# Patient Record
Sex: Female | Born: 1955 | Race: Black or African American | Hispanic: No | Marital: Married | Smoking: Never smoker
Health system: Southern US, Community
[De-identification: ages and names within clinical notes are randomized; demographics above are authoritative.]

## PROBLEM LIST (undated history)

## (undated) ENCOUNTER — Emergency Department (HOSPITAL_BASED_OUTPATIENT_CLINIC_OR_DEPARTMENT_OTHER): Discharge status: Absent without leave | Payer: MEDICARE

## (undated) DIAGNOSIS — E039 Hypothyroidism, unspecified: Secondary | ICD-10-CM

## (undated) DIAGNOSIS — G5600 Carpal tunnel syndrome, unspecified upper limb: Secondary | ICD-10-CM

## (undated) DIAGNOSIS — E7800 Pure hypercholesterolemia, unspecified: Secondary | ICD-10-CM

## (undated) DIAGNOSIS — N390 Urinary tract infection, site not specified: Secondary | ICD-10-CM

## (undated) HISTORY — PX: THYROIDECTOMY, PARTIAL: SHX18

## (undated) HISTORY — PX: CHOLECYSTECTOMY: SHX55

## (undated) HISTORY — PX: BUNIONECTOMY: SHX129

---

## 2011-07-17 ENCOUNTER — Emergency Department (HOSPITAL_BASED_OUTPATIENT_CLINIC_OR_DEPARTMENT_OTHER)
Admission: EM | Admit: 2011-07-17 | Discharge: 2011-07-17 | Disposition: A | Discharge status: Home Health | Payer: BC Managed Care – PPO | Attending: Emergency Medicine | Admitting: Emergency Medicine

## 2011-07-17 ENCOUNTER — Emergency Department (INDEPENDENT_AMBULATORY_CARE_PROVIDER_SITE_OTHER): Payer: BC Managed Care – PPO

## 2011-07-17 ENCOUNTER — Encounter (HOSPITAL_BASED_OUTPATIENT_CLINIC_OR_DEPARTMENT_OTHER): Payer: Self-pay | Admitting: Emergency Medicine

## 2011-07-17 DIAGNOSIS — M79609 Pain in unspecified limb: Secondary | ICD-10-CM | POA: Insufficient documentation

## 2011-07-17 DIAGNOSIS — R509 Fever, unspecified: Secondary | ICD-10-CM | POA: Insufficient documentation

## 2011-07-17 DIAGNOSIS — R300 Dysuria: Secondary | ICD-10-CM | POA: Insufficient documentation

## 2011-07-17 DIAGNOSIS — B338 Other specified viral diseases: Secondary | ICD-10-CM

## 2011-07-17 DIAGNOSIS — Z79899 Other long term (current) drug therapy: Secondary | ICD-10-CM | POA: Insufficient documentation

## 2011-07-17 DIAGNOSIS — N390 Urinary tract infection, site not specified: Secondary | ICD-10-CM

## 2011-07-17 DIAGNOSIS — B349 Viral infection, unspecified: Secondary | ICD-10-CM

## 2011-07-17 DIAGNOSIS — R51 Headache: Secondary | ICD-10-CM | POA: Insufficient documentation

## 2011-07-17 DIAGNOSIS — E039 Hypothyroidism, unspecified: Secondary | ICD-10-CM | POA: Insufficient documentation

## 2011-07-17 DIAGNOSIS — B9789 Other viral agents as the cause of diseases classified elsewhere: Secondary | ICD-10-CM | POA: Insufficient documentation

## 2011-07-17 DIAGNOSIS — E7800 Pure hypercholesterolemia, unspecified: Secondary | ICD-10-CM | POA: Insufficient documentation

## 2011-07-17 HISTORY — DX: Urinary tract infection, site not specified: N39.0

## 2011-07-17 HISTORY — DX: Carpal tunnel syndrome, unspecified upper limb: G56.00

## 2011-07-17 HISTORY — DX: Pure hypercholesterolemia, unspecified: E78.00

## 2011-07-17 HISTORY — DX: Hypothyroidism, unspecified: E03.9

## 2011-07-17 LAB — URINE MICROSCOPIC-ADD ON

## 2011-07-17 LAB — URINALYSIS, ROUTINE W REFLEX MICROSCOPIC
Bilirubin Urine: NEGATIVE
Glucose, UA: NEGATIVE mg/dL
Ketones, ur: NEGATIVE mg/dL
Nitrite: NEGATIVE
Protein, ur: NEGATIVE mg/dL
Protein, ur: NEGATIVE mg/dL
Specific Gravity, Urine: 1.009 (ref 1.005–1.030)
Urobilinogen, UA: 0.2 mg/dL (ref 0.0–1.0)

## 2011-07-17 MED ORDER — SULFAMETHOXAZOLE-TRIMETHOPRIM 800-160 MG PO TABS: 1.0000 | ORAL_TABLET | Freq: Two times a day (BID) | ORAL | Status: AC

## 2011-07-17 NOTE — ED Provider Notes (Signed)
History     CSN: [id number]  Arrival date & time 07/17/11  5621   First MD Initiated Contact with Patient 07/17/11 0820      Chief Complaint  Patient presents with  . Headache  . Fever    (Consider location/radiation/quality/duration/timing/severity/associated sxs/prior treatment) Patient is a 56 y.o. female presenting with headaches and fever. The history is provided by the patient.  Headache  This is a new problem. Associated symptoms include a fever. Pertinent negatives include no shortness of breath, no nausea and no vomiting.  Fever Primary symptoms of the febrile illness include fever and headaches. Primary symptoms do not include shortness of breath, abdominal pain, nausea, vomiting, diarrhea or rash.  The headache is not associated with eye pain, neck stiffness or weakness.   patient's had dull right-sided headache the last couple days. She states that she had chills yesterday and some fevers today. No nausea vomiting diarrhea. She's also a dull ache in her right arm. A chest pain or shortness of breath no neck pain. She has had some dysuria and was recently treated for UTI. No abdominal pain. No vision changes. Her husband has had URI symptoms. Patient's had no nasal drainage or head congestion.  Past Medical History  Diagnosis Date  . Carpal tunnel syndrome   . Hypothyroidism   . Hypercholesteremia   . UTI (urinary tract infection)     Past Surgical History  Procedure Date  . Cholecystectomy   . Bunionectomy     History reviewed. No pertinent family history.  History  Substance Use Topics  . Smoking status: Never Smoker   . Smokeless tobacco: Not on file  . Alcohol Use: No    OB History    Grav Para Term Preterm Abortions TAB SAB Ect Mult Living                  Review of Systems  Constitutional: Positive for fever. Negative for activity change and appetite change.  HENT: Negative for neck stiffness.   Eyes: Negative for pain.  Respiratory: Negative  for chest tightness and shortness of breath.   Cardiovascular: Negative for chest pain and leg swelling.  Gastrointestinal: Negative for nausea, vomiting, abdominal pain and diarrhea.  Genitourinary: Negative for flank pain.  Musculoskeletal: Negative for back pain.  Skin: Negative for rash.  Neurological: Positive for headaches. Negative for weakness and numbness.  Psychiatric/Behavioral: Negative for behavioral problems.    Allergies  Dilaudid  Home Medications   Current Outpatient Rx  Name Route Sig Dispense Refill  . LEVOTHYROXINE SODIUM 88 MCG PO TABS Oral Take 88 mcg by mouth daily.    Marland Kitchen SIMVASTATIN 20 MG PO TABS Oral Take 20 mg by mouth every evening.    . SULFAMETHOXAZOLE-TRIMETHOPRIM 800-160 MG PO TABS Oral Take 1 tablet by mouth 2 (two) times daily. 6 tablet 0    BP 115/68  Pulse 96  Temp(Src) 99.8 F (37.7 C) (Oral)  Resp 18  Ht 5\' 5"  (1.651 m)  Wt 152 lb (68.947 kg)  BMI 25.29 kg/m2  SpO2 95%  Physical Exam  Nursing note and vitals reviewed. Constitutional: She is oriented to person, place, and time. She appears well-developed and well-nourished.  HENT:  Head: Normocephalic and atraumatic.       Slight erythema of right external auditory canal. No TM erythema. No effusion  Eyes: EOM are normal. Pupils are equal, round, and reactive to light.  Neck: Normal range of motion. Neck supple.  Cardiovascular: Normal rate, regular rhythm and  normal heart sounds.   No murmur heard. Pulmonary/Chest: Effort normal. No respiratory distress. She has no wheezes. She has no rales.       Slightly harsh breath sounds.  Abdominal: Soft. Bowel sounds are normal. She exhibits no distension. There is no tenderness. There is no rebound and no guarding.  Musculoskeletal: Normal range of motion.  Neurological: She is alert and oriented to person, place, and time. No cranial nerve deficit.  Skin: Skin is warm and dry.  Psychiatric: She has a normal mood and affect. Her speech is  normal.    ED Course  Procedures (including critical care time)  Labs Reviewed  URINALYSIS, ROUTINE W REFLEX MICROSCOPIC - Abnormal; Notable for the following:    Hgb urine dipstick MODERATE (*)    Leukocytes, UA SMALL (*)    All other components within normal limits  URINE MICROSCOPIC-ADD ON - Abnormal; Notable for the following:    Squamous Epithelial / LPF MANY (*)    Bacteria, UA MANY (*)    All other components within normal limits  URINALYSIS, ROUTINE W REFLEX MICROSCOPIC - Abnormal; Notable for the following:    APPearance CLOUDY (*)    Hgb urine dipstick TRACE (*)    Leukocytes, UA MODERATE (*)    All other components within normal limits  URINE MICROSCOPIC-ADD ON - Abnormal; Notable for the following:    Squamous Epithelial / LPF MANY (*)    Bacteria, UA MANY (*)    All other components within normal limits  URINE CULTURE   Dg Chest 2 View  07/17/2011  *RADIOLOGY REPORT*  Clinical Data: Fever, cold chills, sweats.  CHEST - 2 VIEW  Comparison: None.  Findings: Cardiomediastinal silhouette is within normal limits. The lungs are free of focal consolidations and pleural effusions. Prominent interstitial markings raise the question of viral infection in this nonsmoker.  No edema.  Visualized osseous structures have a normal appearance.  IMPRESSION:  1.  Question of viral pneumonitis. 2. No focal pulmonary abnormality.  Original Report Authenticated By: Patterson Hammersmith, M.D.     1. Viral infection   2. UTI (urinary tract infection)       MDM  Patient census fevers cold chills headaches dysuria. X-ray shows likely viral syndrome. She's recently been treated for UTI with Macrobid. Initial urine was contaminated, repeat cath urine cultures showed many epithelial cells, but still appears to be infection. She'll be treated with Bactrim and culture was sent. She'll followup as needed. I doubt meningitis as a cause of the headache        Juliet Rude. Rubin Payor, MD 07/17/11  [phone]

## 2011-07-17 NOTE — Discharge Instructions (Signed)
Urinary Tract Infection Infections of the urinary tract can start in several places. A bladder infection (cystitis), a kidney infection (pyelonephritis), and a prostate infection (prostatitis) are different types of urinary tract infections (UTIs). They usually get better if treated with medicines (antibiotics) that kill germs. Take all the medicine until it is gone. You or your child may feel better in a few days, but TAKE ALL MEDICINE or the infection may not respond and may become more difficult to treat. HOME CARE INSTRUCTIONS   Drink enough water and fluids to keep the urine clear or pale yellow. Cranberry juice is especially recommended, in addition to large amounts of water.   Avoid caffeine, tea, and carbonated beverages. They tend to irritate the bladder.   Alcohol may irritate the prostate.   Only take over-the-counter or prescription medicines for pain, discomfort, or fever as directed by your caregiver.  To prevent further infections:  Empty the bladder often. Avoid holding urine for long periods of time.   After a bowel movement, women should cleanse from front to back. Use each tissue only once.   Empty the bladder before and after sexual intercourse.  FINDING OUT THE RESULTS OF YOUR TEST Not all test results are available during your visit. If your or your child's test results are not back during the visit, make an appointment with your caregiver to find out the results. Do not assume everything is normal if you have not heard from your caregiver or the medical facility. It is important for you to follow up on all test results. SEEK MEDICAL CARE IF:   There is back pain.   Your baby is older than 3 months with a rectal temperature of 100.5 F (38.1 C) or higher for more than 1 day.   Your or your child's problems (symptoms) are no better in 3 days. Return sooner if you or your child is getting worse.  SEEK IMMEDIATE MEDICAL CARE IF:   There is severe back pain or lower  abdominal pain.   You or your child develops chills.   You have a fever.   Your baby is older than 3 months with a rectal temperature of 102 F (38.9 C) or higher.   Your baby is 28 months old or younger with a rectal temperature of 100.4 F (38 C) or higher.   There is nausea or vomiting.   There is continued burning or discomfort with urination.  MAKE SURE YOU:   Understand these instructions.   Will watch your condition.   Will get help right away if you are not doing well or get worse.  Document Released: 02/09/2005 Document Revised: 04/21/2011 Document Reviewed: 09/14/2006 [facility] Patient Information 2012 [place], [place].Viral Infections A viral infection can be caused by different types of viruses.Most viral infections are not serious and resolve on their own. However, some infections may cause severe symptoms and may lead to further complications. SYMPTOMS Viruses can frequently cause:  Minor sore throat.   Aches and pains.   Headaches.   Runny nose.   Different types of rashes.   Watery eyes.   Tiredness.   Cough.   Loss of appetite.   Gastrointestinal infections, resulting in nausea, vomiting, and diarrhea.  These symptoms do not respond to antibiotics because the infection is not caused by bacteria. However, you might catch a bacterial infection following the viral infection. This is sometimes called a "superinfection." Symptoms of such a bacterial infection may include:  Worsening sore throat with pus and difficulty  swallowing.   Swollen neck glands.   Chills and a high or persistent fever.   Severe headache.   Tenderness over the sinuses.   Persistent overall ill feeling (malaise), muscle aches, and tiredness (fatigue).   Persistent cough.   Yellow, green, or brown mucus production with coughing.  HOME CARE INSTRUCTIONS   Only take over-the-counter or prescription medicines for pain, discomfort, diarrhea, or fever as directed by your  caregiver.   Drink enough water and fluids to keep your urine clear or pale yellow. Sports drinks can provide valuable electrolytes, sugars, and hydration.   Get plenty of rest and maintain proper nutrition. Soups and broths with crackers or rice are fine.  SEEK IMMEDIATE MEDICAL CARE IF:   You have severe headaches, shortness of breath, chest pain, neck pain, or an unusual rash.   You have uncontrolled vomiting, diarrhea, or you are unable to keep down fluids.   You or your child has an oral temperature above 102 F (38.9 C), not controlled by medicine.   Your baby is older than 3 months with a rectal temperature of 102 F (38.9 C) or higher.   Your baby is 20 months old or younger with a rectal temperature of 100.4 F (38 C) or higher.  MAKE SURE YOU:   Understand these instructions.   Will watch your condition.   Will get help right away if you are not doing well or get worse.  Document Released: 02/09/2005 Document Revised: 04/21/2011 Document Reviewed: 09/06/2010 [facility] Patient Information 2012 [place], [place].

## 2011-07-17 NOTE — ED Notes (Signed)
Pt states she has been having pain behind her right eye for two days.  Fever started last night with cold chills and sweats.   Pt also states that her right arm has been aching for two days.  No chest pain, no SOB, no neck pain.

## 2011-07-19 LAB — URINE CULTURE
Colony Count: NO GROWTH
Culture: NO GROWTH

## 2012-11-13 ENCOUNTER — Encounter (HOSPITAL_BASED_OUTPATIENT_CLINIC_OR_DEPARTMENT_OTHER): Payer: Self-pay | Admitting: *Deleted

## 2012-11-13 ENCOUNTER — Emergency Department (HOSPITAL_BASED_OUTPATIENT_CLINIC_OR_DEPARTMENT_OTHER): Payer: BC Managed Care – PPO

## 2012-11-13 ENCOUNTER — Emergency Department (HOSPITAL_BASED_OUTPATIENT_CLINIC_OR_DEPARTMENT_OTHER)
Admission: EM | Admit: 2012-11-13 | Discharge: 2012-11-13 | Disposition: A | Discharge status: Home / Self Care | Payer: BC Managed Care – PPO | Attending: Emergency Medicine | Admitting: Emergency Medicine

## 2012-11-13 DIAGNOSIS — R109 Unspecified abdominal pain: Secondary | ICD-10-CM | POA: Insufficient documentation

## 2012-11-13 DIAGNOSIS — R42 Dizziness and giddiness: Secondary | ICD-10-CM | POA: Insufficient documentation

## 2012-11-13 DIAGNOSIS — R61 Generalized hyperhidrosis: Secondary | ICD-10-CM | POA: Insufficient documentation

## 2012-11-13 DIAGNOSIS — R110 Nausea: Secondary | ICD-10-CM | POA: Insufficient documentation

## 2012-11-13 DIAGNOSIS — E7800 Pure hypercholesterolemia, unspecified: Secondary | ICD-10-CM | POA: Insufficient documentation

## 2012-11-13 DIAGNOSIS — R197 Diarrhea, unspecified: Secondary | ICD-10-CM | POA: Insufficient documentation

## 2012-11-13 DIAGNOSIS — Z8669 Personal history of other diseases of the nervous system and sense organs: Secondary | ICD-10-CM | POA: Insufficient documentation

## 2012-11-13 DIAGNOSIS — E039 Hypothyroidism, unspecified: Secondary | ICD-10-CM | POA: Insufficient documentation

## 2012-11-13 DIAGNOSIS — R55 Syncope and collapse: Secondary | ICD-10-CM | POA: Insufficient documentation

## 2012-11-13 DIAGNOSIS — Z87440 Personal history of urinary (tract) infections: Secondary | ICD-10-CM | POA: Insufficient documentation

## 2012-11-13 DIAGNOSIS — Z79899 Other long term (current) drug therapy: Secondary | ICD-10-CM | POA: Insufficient documentation

## 2012-11-13 LAB — CBC WITH DIFFERENTIAL/PLATELET
Basophils Relative: 0 % (ref 0–1)
Eosinophils Absolute: 0.1 10*3/uL (ref 0.0–0.7)
Hemoglobin: 13.1 g/dL (ref 12.0–15.0)
Lymphs Abs: 2 10*3/uL (ref 0.7–4.0)
MCH: 31 pg (ref 26.0–34.0)
Monocytes Relative: 9 % (ref 3–12)
Neutro Abs: 4.5 10*3/uL (ref 1.7–7.7)
Neutrophils Relative %: 63 % (ref 43–77)
Platelets: 246 10*3/uL (ref 150–400)
RBC: 4.22 MIL/uL (ref 3.87–5.11)

## 2012-11-13 LAB — COMPREHENSIVE METABOLIC PANEL
AST: 28 U/L (ref 0–37)
BUN: 13 mg/dL (ref 6–23)
Calcium: 9.3 mg/dL (ref 8.4–10.5)
Chloride: 102 mEq/L (ref 96–112)
GFR calc Af Amer: 80 mL/min — ABNORMAL LOW (ref >90)
GFR calc non Af Amer: 69 mL/min — ABNORMAL LOW (ref >90)
Glucose, Bld: 109 mg/dL — ABNORMAL HIGH (ref 70–99)
Potassium: 3.3 mEq/L — ABNORMAL LOW (ref 3.5–5.1)
Sodium: 140 mEq/L (ref 135–145)

## 2012-11-13 LAB — TROPONIN I: Troponin I: <0.3 ng/mL (ref <0.30)

## 2012-11-13 MED ORDER — SODIUM CHLORIDE 0.9 % IV BOLUS (SEPSIS): 1000.0000 mL | Freq: Once | INTRAVENOUS | Status: DC

## 2012-11-13 MED ORDER — SODIUM CHLORIDE 0.9 % IV BOLUS (SEPSIS)
1000.0000 mL | Freq: Once | INTRAVENOUS | Status: AC
  Administered 2012-11-13: 1000 mL via INTRAVENOUS

## 2012-11-13 MED ORDER — ONDANSETRON HCL 4 MG PO TABS: 4.0000 mg | ORAL_TABLET | Freq: Four times a day (QID) | ORAL | Status: DC

## 2012-11-13 MED ORDER — ONDANSETRON HCL 4 MG/2ML IJ SOLN
4.0000 mg | Freq: Once | INTRAMUSCULAR | Status: AC
  Administered 2012-11-13: 4 mg via INTRAVENOUS
  Filled 2012-11-13: qty 2

## 2012-11-13 NOTE — ED Notes (Signed)
Pt ambulatory to bathroom independently. Pt states she feels much better.

## 2012-11-13 NOTE — ED Provider Notes (Addendum)
This chart was scribed for Glynn Octave, MD, by Candelaria Stagers, ED Scribe. This patient was seen in room MH09/MH09 and the patient's care was started at 4:49 PM   History    CSN: [id number] Arrival date & time 11/13/12  1557  First MD Initiated Contact with Patient 11/13/12 1645     Chief Complaint  Patient presents with  . Loss of Consciousness    HPI HPI Comments: Aimee Robinson is a 57 y.o. female who presents to the Emergency Department complaining of a syncopal episode that occurred earlier today while sitting at her desk.  She reports experiencing abdominal pain, diarrhea, nausea, diaphoresis, and dizziness before syncope.  She denies chest pain, SOB, or headache.  Pt reports she is now feeling better, but is still experiencing nausea and dizziness.  Pt states that she did not eat breakfast this morning.  She reports eating and drinking normally before today.    Past Medical History  Diagnosis Date  . Carpal tunnel syndrome   . Hypothyroidism   . Hypercholesteremia   . UTI (urinary tract infection)    Past Surgical History  Procedure Laterality Date  . Cholecystectomy    . Bunionectomy     History reviewed. No pertinent family history. History  Substance Use Topics  . Smoking status: Never Smoker   . Smokeless tobacco: Not on file  . Alcohol Use: No   OB History   Grav Para Term Preterm Abortions TAB SAB Ect Mult Living                 Review of Systems  Allergies  Dilaudid  Home Medications   Current Outpatient Rx  Name  Route  Sig  Dispense  Refill  . fish oil-omega-3 fatty acids 1000 MG capsule   Oral   Take 2 g by mouth daily.         Marland Kitchen levothyroxine (SYNTHROID, LEVOTHROID) 88 MCG tablet   Oral   Take 88 mcg by mouth daily.         . ondansetron (ZOFRAN) 4 MG tablet   Oral   Take 1 tablet (4 mg total) by mouth every 6 (six) hours.   12 tablet   0   . simvastatin (ZOCOR) 20 MG tablet   Oral   Take 20 mg by mouth every evening.           BP 157/73  Pulse 79  Temp(Src) 97.8 F (36.6 C) (Oral)  Resp 16  Ht 5\' 5"  (1.651 m)  Wt 150 lb (68.04 kg)  BMI 24.96 kg/m2  SpO2 100% Physical Exam  Nursing note and vitals reviewed. Constitutional: She is oriented to person, place, and time. She appears well-developed and well-nourished. No distress.  HENT:  Head: Normocephalic and atraumatic.  Right Ear: External ear normal.  Left Ear: External ear normal.  Nose: Nose normal.  Mouth/Throat: Oropharynx is clear and moist.  Eyes: Conjunctivae and EOM are normal. Pupils are equal, round, and reactive to light.  Neck: Normal range of motion. Neck supple.  Cardiovascular: Normal rate, regular rhythm, normal heart sounds and intact distal pulses.   No murmur heard. Intact and equal femoral, DP, PT pulses  Pulmonary/Chest: Effort normal and breath sounds normal.  Abdominal: Soft. Bowel sounds are normal. There is no tenderness. There is no rebound and no guarding.  No pulsatile mass  Musculoskeletal: Normal range of motion. She exhibits no tenderness.  Neurological: She is alert and oriented to person, place, and time. She has normal  reflexes. No cranial nerve deficit. She exhibits normal muscle tone. Coordination normal.  CN 2-12 intact, no ataxia on finger to nose, no nystagmus, 5/5 strength throughout, no pronator drift, Romberg negative, normal gait.   Skin: Skin is warm and dry.  Psychiatric: She has a normal mood and affect. Her behavior is normal. Thought content normal.    ED Course  Procedures   DIAGNOSTIC STUDIES: Oxygen Saturation is 100% on room air, normal by my interpretation.    COORDINATION OF CARE:  4:52 PM Discussed course of care with pt which includes basic blood work.  Pt understands and agrees.   6:28 PM Recheck: Discussed results with pt.   7:59 PM Recheck: Pt reports she is feeling much better.   Labs Reviewed  COMPREHENSIVE METABOLIC PANEL - Abnormal; Notable for the following:    Potassium  3.3 (*)    Glucose, Bld 109 (*)    GFR calc non Af Amer 69 (*)    GFR calc Af Amer 80 (*)    All other components within normal limits  CBC WITH DIFFERENTIAL  TROPONIN I  TROPONIN I  URINALYSIS, ROUTINE W REFLEX MICROSCOPIC   Dg Chest 2 View  11/13/2012   *RADIOLOGY REPORT*  Clinical Data: Lightheadedness, syncopal episode, sweats, diarrhea  CHEST - 2 VIEW  Comparison: 07/17/2011  Findings:  Grossly unchanged cardiac silhouette and mediastinal contours. There is grossly unchanged mild diffuse thickening of the pulmonary interstitium.  Grossly unchanged minimal left basilar/retrocardiac linear heterogeneous opacities favored to represent atelectasis or scar.  No focal airspace opacity.  No pleural effusion or pneumothorax.  No definite evidence of edema.  Unchanged bones including mild scoliotic curvature of the thoracolumbar spine.  IMPRESSION: Suspected mild bronchitic change without acute cardiopulmonary disease.   Original Report Authenticated By: Tacey Ruiz, MD   Ct Head Wo Contrast  11/13/2012   *RADIOLOGY REPORT*  Clinical Data: Loss of consciousness, lightheaded, dizziness, nausea/vomiting  CT HEAD WITHOUT CONTRAST  Technique:  Contiguous axial images were obtained from the base of the skull through the vertex without contrast.  Comparison: None.  Findings: No evidence of parenchymal hemorrhage or extra-axial fluid collection. No mass lesion, mass effect, or midline shift.  No CT evidence of acute infarction.  Cerebral volume is age appropriate.  No ventriculomegaly.  The visualized paranasal sinuses are essentially clear. The mastoid air cells are unopacified.  No evidence of calvarial fracture.  IMPRESSION: No evidence of acute intracranial abnormality.   Original Report Authenticated By: Charline Bills, M.D.   1. Syncope     MDM  Syncopal episode while sitting preceded by dizziness, lightheadedness, nausea, flushing and diarrhea. No chest pain or shortness of breath. No headache. No  abdominal pain.  Nonfocal neuro exam. EKG normal sinus rhythm. No murmurs on exam. Labs unremarkable.  Suspect vasovagal syncope as patient had prodrome of dizziness, lightheadedness, nausea. No chest pain or shortness of breath. Orthostatics are positive. [place] syncope criteria negative. Delta troponin negative.  Tolerating PO in the ED, ambulatory without dizziness. States she is feeling back to baseline.   Date: 11/13/2012  Rate: 71  Rhythm: normal sinus rhythm  QRS Axis: normal  Intervals: normal  ST/T Wave abnormalities: normal  Conduction Disutrbances:none  Narrative Interpretation:   Old EKG Reviewed: unchanged  BP 145/75  Pulse 78  Temp(Src) 97.8 F (36.6 C) (Oral)  Resp 16  Ht 5\' 5"  (1.651 m)  Wt 150 lb (68.04 kg)  BMI 24.96 kg/m2  SpO2 100%   I personally  performed the services described in this documentation, which was scribed in my presence. The recorded information has been reviewed and is accurate.    Glynn Octave, MD 11/13/12 2010  Glynn Octave, MD 11/14/12 [phone]

## 2012-11-13 NOTE — ED Notes (Signed)
Pt reports syncopal episode while sitting at desk at work also n/v x 1 day

## 2016-08-20 ENCOUNTER — Emergency Department (HOSPITAL_BASED_OUTPATIENT_CLINIC_OR_DEPARTMENT_OTHER)
Admission: EM | Admit: 2016-08-20 | Discharge: 2016-08-20 | Disposition: A | Discharge status: Home / Self Care | Payer: Managed Care, Other (non HMO) | Attending: Emergency Medicine | Admitting: Emergency Medicine

## 2016-08-20 ENCOUNTER — Encounter (HOSPITAL_BASED_OUTPATIENT_CLINIC_OR_DEPARTMENT_OTHER): Payer: Self-pay | Admitting: Emergency Medicine

## 2016-08-20 DIAGNOSIS — Z79899 Other long term (current) drug therapy: Secondary | ICD-10-CM | POA: Diagnosis not present

## 2016-08-20 DIAGNOSIS — R197 Diarrhea, unspecified: Secondary | ICD-10-CM | POA: Diagnosis not present

## 2016-08-20 DIAGNOSIS — E039 Hypothyroidism, unspecified: Secondary | ICD-10-CM | POA: Diagnosis not present

## 2016-08-20 DIAGNOSIS — R51 Headache: Secondary | ICD-10-CM | POA: Insufficient documentation

## 2016-08-20 DIAGNOSIS — R112 Nausea with vomiting, unspecified: Secondary | ICD-10-CM | POA: Diagnosis present

## 2016-08-20 LAB — URINALYSIS, ROUTINE W REFLEX MICROSCOPIC
Bilirubin Urine: NEGATIVE
Glucose, UA: NEGATIVE mg/dL
Ketones, ur: 15 mg/dL — AB
Nitrite: NEGATIVE
Protein, ur: NEGATIVE mg/dL
Specific Gravity, Urine: 1.024 (ref 1.005–1.030)
pH: 6 (ref 5.0–8.0)

## 2016-08-20 LAB — COMPREHENSIVE METABOLIC PANEL
ALT: 38 U/L (ref 14–54)
AST: 53 U/L — ABNORMAL HIGH (ref 15–41)
Albumin: 3.8 g/dL (ref 3.5–5.0)
Alkaline Phosphatase: 98 U/L (ref 38–126)
Anion gap: 7 (ref 5–15)
BUN: 9 mg/dL (ref 6–20)
CO2: 25 mmol/L (ref 22–32)
Calcium: 7.9 mg/dL — ABNORMAL LOW (ref 8.9–10.3)
Chloride: 103 mmol/L (ref 101–111)
Creatinine, Ser: 0.78 mg/dL (ref 0.44–1.00)
GFR calc Af Amer: >60 mL/min (ref >60)
GFR calc non Af Amer: >60 mL/min (ref >60)
Glucose, Bld: 104 mg/dL — ABNORMAL HIGH (ref 65–99)
Potassium: 3.6 mmol/L (ref 3.5–5.1)
Sodium: 135 mmol/L (ref 135–145)
Total Bilirubin: 1 mg/dL (ref 0.3–1.2)
Total Protein: 6.9 g/dL (ref 6.5–8.1)

## 2016-08-20 LAB — URINALYSIS, MICROSCOPIC (REFLEX)

## 2016-08-20 LAB — CBC WITH DIFFERENTIAL/PLATELET
Basophils Absolute: 0 10*3/uL (ref 0.0–0.1)
Basophils Relative: 0 %
Eosinophils Absolute: 0.1 10*3/uL (ref 0.0–0.7)
Eosinophils Relative: 3 %
HCT: 37.6 % (ref 36.0–46.0)
Hemoglobin: 12.5 g/dL (ref 12.0–15.0)
Lymphocytes Relative: 24 %
Lymphs Abs: 1 10*3/uL (ref 0.7–4.0)
MCH: 30.4 pg (ref 26.0–34.0)
MCHC: 33.2 g/dL (ref 30.0–36.0)
MCV: 91.5 fL (ref 78.0–100.0)
Monocytes Absolute: 0.5 10*3/uL (ref 0.1–1.0)
Monocytes Relative: 11 %
Neutro Abs: 2.8 10*3/uL (ref 1.7–7.7)
Neutrophils Relative %: 63 %
Platelets: 211 10*3/uL (ref 150–400)
RBC: 4.11 MIL/uL (ref 3.87–5.11)
RDW: 12.1 % (ref 11.5–15.5)
WBC: 4.4 10*3/uL (ref 4.0–10.5)

## 2016-08-20 MED ORDER — ONDANSETRON HCL 4 MG PO TABS: 4.0000 mg | ORAL_TABLET | Freq: Four times a day (QID) | ORAL | 0 refills | Status: DC

## 2016-08-20 MED ORDER — ONDANSETRON HCL 4 MG/2ML IJ SOLN
4.0000 mg | Freq: Once | INTRAMUSCULAR | Status: AC
  Administered 2016-08-20: 4 mg via INTRAVENOUS
  Filled 2016-08-20: qty 2

## 2016-08-20 MED ORDER — SODIUM CHLORIDE 0.9 % IV BOLUS (SEPSIS)
1000.0000 mL | Freq: Once | INTRAVENOUS | Status: AC
  Administered 2016-08-20: 1000 mL via INTRAVENOUS

## 2016-08-20 NOTE — ED Provider Notes (Signed)
[facility] Provider Note   CSN: [id number] Arrival date & time: 08/20/16  8295     History   Chief Complaint Chief Complaint  Patient presents with  . Diarrhea    HPI Aimee Robinson is a 61 y.o. female with history of hypothyroidism who presents with a one-day history of headache, nausea, vomiting, diarrhea. Patient states she had a headache and decreased appetite yesterday. Patient's headache has resolved, however patient began with 2 episodes of nonbloody diarrhea this morning associated with nausea. Patient had one episode of nonbloody emesis. Patient has been taking care of her aunt with similar symptoms. Patient denies any fevers or abdominal pain. Patient notes an associated gurgling sensation, but no pain. She also denies any chest pain, shortness of breath, or urinary symptoms. Patient has not taken any medications for her symptoms.  HPI  Past Medical History:  Diagnosis Date  . Carpal tunnel syndrome   . Hypercholesteremia   . Hypothyroidism   . UTI (urinary tract infection)     There are no active problems to display for this patient.   Past Surgical History:  Procedure Laterality Date  . BUNIONECTOMY    . CHOLECYSTECTOMY    . THYROIDECTOMY, PARTIAL      OB History    No data available       Home Medications    Prior to Admission medications   Medication Sig Start Date End Date Taking? Authorizing Provider  levothyroxine (SYNTHROID, LEVOTHROID) 88 MCG tablet Take 88 mcg by mouth daily.   Yes Historical Provider, MD  simvastatin (ZOCOR) 20 MG tablet Take 20 mg by mouth every evening.   Yes Historical Provider, MD  fish oil-omega-3 fatty acids 1000 MG capsule Take 2 g by mouth daily.    Historical Provider, MD  ondansetron (ZOFRAN) 4 MG tablet Take 1 tablet (4 mg total) by mouth every 6 (six) hours. 08/20/16   Emi Holes, PA-C    Family History No family history on file.  Social History Social History  Substance Use Topics  .  Smoking status: Never Smoker  . Smokeless tobacco: Never Used  . Alcohol use No     Allergies   Dilaudid [hydromorphone hcl]   Review of Systems Review of Systems  Constitutional: Positive for appetite change. Negative for chills and fever.  HENT: Negative for facial swelling and sore throat.   Respiratory: Negative for shortness of breath.   Cardiovascular: Negative for chest pain.  Gastrointestinal: Positive for diarrhea, nausea and vomiting. Negative for abdominal pain and blood in stool.  Genitourinary: Negative for dysuria.  Musculoskeletal: Negative for back pain.  Skin: Negative for rash and wound.  Neurological: Positive for headaches (resolved, none at this time).  Psychiatric/Behavioral: The patient is not nervous/anxious.      Physical Exam Updated Vital Signs BP 125/76 (BP Location: Right Arm)   Pulse 88   Temp 98.6 F (37 C) (Oral)   Resp 16   Ht  (1.651 m)   Wt 67.6 kg   SpO2 100%   BMI 24.79 kg/m   Physical Exam  Constitutional: She appears well-developed and well-nourished. No distress.  HENT:  Head: Normocephalic and atraumatic.  Mouth/Throat: Oropharynx is clear and moist. No oropharyngeal exudate.  Eyes: Conjunctivae are normal. Pupils are equal, round, and reactive to light. Right eye exhibits no discharge. Left eye exhibits no discharge. No scleral icterus.  Neck: Normal range of motion. Neck supple. No thyromegaly present.  Cardiovascular: Normal rate, regular rhythm, normal heart  sounds and intact distal pulses.  Exam reveals no gallop and no friction rub.   No murmur heard. Pulmonary/Chest: Effort normal and breath sounds normal. No stridor. No respiratory distress. She has no wheezes. She has no rales.  Abdominal: Soft. Bowel sounds are normal. She exhibits no distension. There is no tenderness. There is no rebound and no guarding.  Musculoskeletal: She exhibits no edema.  Lymphadenopathy:    She has no cervical adenopathy.    Neurological: She is alert. Coordination normal.  Skin: Skin is warm and dry. No rash noted. She is not diaphoretic. No pallor.  Psychiatric: She has a normal mood and affect.  Nursing note and vitals reviewed.    ED Treatments / Results  Labs (all labs ordered are listed, but only abnormal results are displayed) Labs Reviewed  COMPREHENSIVE METABOLIC PANEL - Abnormal; Notable for the following:       Result Value   Glucose, Bld 104 (*)    Calcium 7.9 (*)    AST 53 (*)    All other components within normal limits  URINALYSIS, ROUTINE W REFLEX MICROSCOPIC - Abnormal; Notable for the following:    Color, Urine AMBER (*)    APPearance CLOUDY (*)    Hgb urine dipstick SMALL (*)    Ketones, ur 15 (*)    Leukocytes, UA SMALL (*)    All other components within normal limits  URINALYSIS, MICROSCOPIC (REFLEX) - Abnormal; Notable for the following:    Bacteria, UA MANY (*)    Squamous Epithelial / LPF 0-5 (*)    All other components within normal limits  URINE CULTURE  CBC WITH DIFFERENTIAL/PLATELET    EKG  EKG Interpretation None       Radiology No results found.  Procedures Procedures (including critical care time)  Medications Ordered in ED Medications  sodium chloride 0.9 % bolus 1,000 mL (0 mLs Intravenous Stopped 08/20/16 1137)  ondansetron (ZOFRAN) injection 4 mg (4 mg Intravenous Given 08/20/16 1006)     Initial Impression / Assessment and Plan / ED Course  I have reviewed the triage vital signs and the nursing notes.  Pertinent labs & imaging results that were available during my care of the patient were reviewed by me and considered in my medical decision making (see chart for details).     Patient with symptoms consistent with viral gastroenteritis.  CBC unremarkable. CMP shows calcium 7.9, AST 53. I made patient aware to have these levels rechecked by primary care provider. UA shows small leukocytes, 15 ketones, many bacteria. Urine culture sent, and would  treat if positive. Vitals are stable, no fever.  No signs of dehydration, tolerating PO fluids > 6 oz.  Lungs are clear.  No focal abdominal pain, no concern for appendicitis, cholecystitis, pancreatitis, ruptured viscus, UTI, kidney stone, or any other abdominal etiology.  Patient given 1 L of fluid in the ED and Zofran and feels much better. Supportive therapy indicated with return if symptoms worsen. Patient vitals stable throughout ED course and discharged in satisfactory condition.    Final Clinical Impressions(s) / ED Diagnoses   Final diagnoses:  Nausea vomiting and diarrhea    New Prescriptions New Prescriptions   ONDANSETRON (ZOFRAN) 4 MG TABLET    Take 1 tablet (4 mg total) by mouth every 6 (six) hours.     Emi Holes, PA-C 08/20/16 1203    Jerelyn Scott, MD 08/20/16 [phone]

## 2016-08-20 NOTE — ED Notes (Signed)
States," I feel much better"  

## 2016-08-20 NOTE — ED Notes (Signed)
Given gingerale for po fluid challenge 

## 2016-08-20 NOTE — Discharge Instructions (Signed)
Medications: Zofran  Treatment: Take Zofran every 6 hours as needed for nausea or vomiting. Make sure to drink plenty of fluids.  Follow-up: Please follow-up with your primary care provider in 2-3 days if your symptoms are not improving. Please return to emergency department you develop any new or worsening symptoms including development of severe abdominal pain, intractable vomiting, fever over 100.4, or any other new or concerning symptoms.

## 2016-08-20 NOTE — ED Triage Notes (Signed)
N/V/D since last night. H/A last night, denies at present.

## 2016-08-21 LAB — URINE CULTURE

## 2017-03-10 ENCOUNTER — Encounter (HOSPITAL_BASED_OUTPATIENT_CLINIC_OR_DEPARTMENT_OTHER): Payer: Self-pay

## 2017-03-10 ENCOUNTER — Emergency Department (HOSPITAL_BASED_OUTPATIENT_CLINIC_OR_DEPARTMENT_OTHER): Payer: Managed Care, Other (non HMO)

## 2017-03-10 ENCOUNTER — Emergency Department (HOSPITAL_BASED_OUTPATIENT_CLINIC_OR_DEPARTMENT_OTHER)
Admission: EM | Admit: 2017-03-10 | Discharge: 2017-03-10 | Disposition: A | Discharge status: Home / Self Care | Payer: Managed Care, Other (non HMO) | Attending: Emergency Medicine | Admitting: Emergency Medicine

## 2017-03-10 DIAGNOSIS — Z79899 Other long term (current) drug therapy: Secondary | ICD-10-CM | POA: Diagnosis not present

## 2017-03-10 DIAGNOSIS — Z9049 Acquired absence of other specified parts of digestive tract: Secondary | ICD-10-CM | POA: Insufficient documentation

## 2017-03-10 DIAGNOSIS — E039 Hypothyroidism, unspecified: Secondary | ICD-10-CM | POA: Insufficient documentation

## 2017-03-10 DIAGNOSIS — M79671 Pain in right foot: Secondary | ICD-10-CM | POA: Insufficient documentation

## 2017-03-10 NOTE — ED Triage Notes (Signed)
C/o pain to left leg foot to calf x 3 days-denies injury-NAD-steady gait

## 2017-03-10 NOTE — Discharge Instructions (Signed)
Please read and follow all provided instructions.  Your diagnoses today include:  1. Right foot pain     Tests performed today include:  An x-ray of the affected area - does NOT show any broken bones, does show some arthritis in the foot  Ultrasound of your right leg does not show any blood clots  Vital signs. See below for your results today.   Medications prescribed:   None  Take any prescribed medications only as directed.  Home care instructions:   Follow any educational materials contained in this packet  Follow R.I.C.E. Protocol:  R - rest your injury   I  - use ice on injury without applying directly to skin  C - compress injury with bandage or splint  E - elevate the injury as much as possible  Follow-up instructions: Please follow-up with your primary care provider or the provided podiatrist if you continue to have significant pain in 1 week.   Return instructions:   Please return if your toes or feet are numb or tingling, appear gray or blue, or you have severe pain (also elevate the leg and loosen splint or wrap if you were given one)  Please return to the Emergency Department if you experience worsening symptoms.   Please return if you have any other emergent concerns.  Additional Information:  Your vital signs today were: BP (!) 157/86 (BP Location: Left Arm)    Pulse 78    Temp 98.7 F (37.1 C) (Oral)    Resp 18    Ht 5\' 5"  (1.651 m)    Wt 71.8 kg (158 lb 4.6 oz)    SpO2 98%    BMI 26.34 kg/m  If your blood pressure (BP) was elevated above 135/85 this visit, please have this repeated by your doctor within one month. --------------

## 2017-03-10 NOTE — ED Provider Notes (Signed)
[facility] EMERGENCY DEPARTMENT Provider Note   CSN: [id number] Arrival date & time: 03/10/17  2027     History   Chief Complaint Chief Complaint  Patient presents with  . Leg Pain    HPI Aimee Robinson is a 61 y.o. female.  Patient presents with complaint of right foot and calf pain for the past 3 days.  Patient has had some swelling over the top of her foot.  She denies acute injuries but has been on her feet a lot recently.  Pain radiates up into her calf.  She was concerned about a blood clot.  No history of clots.  No chest pain or shortness of breath.  No numbness or tingling in the foot.  No treatments prior to arrival.      Past Medical History:  Diagnosis Date  . Carpal tunnel syndrome   . Hypercholesteremia   . Hypothyroidism   . UTI (urinary tract infection)     There are no active problems to display for this patient.   Past Surgical History:  Procedure Laterality Date  . BUNIONECTOMY    . CHOLECYSTECTOMY    . THYROIDECTOMY, PARTIAL      OB History    No data available       Home Medications    Prior to Admission medications   Medication Sig Start Date End Date Taking? Authorizing Provider  fish oil-omega-3 fatty acids 1000 MG capsule Take 2 g by mouth daily.    [provider]  levothyroxine (SYNTHROID, LEVOTHROID) 88 MCG tablet Take 88 mcg by mouth daily.    [provider]    Family History No family history on file.  Social History Social History  Substance Use Topics  . Smoking status: Never Smoker  . Smokeless tobacco: Never Used  . Alcohol use No     Allergies   Dilaudid [hydromorphone hcl]   Review of Systems Review of Systems  Constitutional: Negative for activity change.  Musculoskeletal: Positive for arthralgias and joint swelling. Negative for back pain and neck pain.  Skin: Negative for wound.  Neurological: Negative for weakness and numbness.     Physical Exam Updated Vital  Signs BP (!) 157/86 (BP Location: Left Arm)   Pulse 78   Temp 98.7 F (37.1 C) (Oral)   Resp 18   Ht 5\' 5"  (1.651 m)   Wt 71.8 kg (158 lb 4.6 oz)   SpO2 98%   BMI 26.34 kg/m   Physical Exam  Constitutional: She appears well-developed and well-nourished.  HENT:  Head: Normocephalic and atraumatic.  Eyes: Pupils are equal, round, and reactive to light.  Neck: Normal range of motion. Neck supple.  Cardiovascular: Normal pulses.  Exam reveals no decreased pulses.   Musculoskeletal: She exhibits edema and tenderness.       Right ankle: Normal. No tenderness. No lateral malleolus and no medial malleolus tenderness found.       Right lower leg: She exhibits no tenderness, no bony tenderness and no swelling.       Right foot: There is tenderness, bony tenderness and swelling. There is normal range of motion and normal capillary refill.       Feet:  Neurological: She is alert. No sensory deficit.  Motor, sensation, and vascular distal to the injury is fully intact.   Skin: Skin is warm and dry.  Psychiatric: She has a normal mood and affect.  Nursing note and vitals reviewed.    ED Treatments / Results  Radiology US Venous Img Lower Unilateral Right  Result Date: 03/10/2017 CLINICAL DATA:  61 year old female with right lower extremity pain and swelling. EXAM: Right LOWER EXTREMITY VENOUS DOPPLER ULTRASOUND TECHNIQUE: Gray-scale sonography with graded compression, as well as color Doppler and duplex ultrasound were performed to evaluate the lower extremity deep venous systems from the level of the common femoral vein and including the common femoral, femoral, profunda femoral, popliteal and calf veins including the posterior tibial, peroneal and gastrocnemius veins when visible. The superficial great saphenous vein was also interrogated. Spectral Doppler was utilized to evaluate flow at rest and with distal augmentation maneuvers in the common femoral, femoral and popliteal veins.  COMPARISON:  None. FINDINGS: Contralateral Common Femoral Vein: Respiratory phasicity is normal and symmetric with the symptomatic side. No evidence of thrombus. Normal compressibility. Common Femoral Vein: No evidence of thrombus. Normal compressibility, respiratory phasicity and response to augmentation. Saphenofemoral Junction: No evidence of thrombus. Normal compressibility and flow on color Doppler imaging. Profunda Femoral Vein: No evidence of thrombus. Normal compressibility and flow on color Doppler imaging. Femoral Vein: No evidence of thrombus. Normal compressibility, respiratory phasicity and response to augmentation. Popliteal Vein: No evidence of thrombus. Normal compressibility, respiratory phasicity and response to augmentation. Calf Veins: No evidence of thrombus. Normal compressibility and flow on color Doppler imaging. Superficial Great Saphenous Vein: No evidence of thrombus. Normal compressibility. Venous Reflux:  None. Other Findings:  None. IMPRESSION: No evidence of deep venous thrombosis in the right lower extremity. Electronically Signed   By: Elgie Collard M.D.   On: 03/10/2017 21:44   Dg Foot Complete Right  Result Date: 03/10/2017 CLINICAL DATA:  Pain in the left leg extending from the foot to the calf for 3 days. No known injury. EXAM: RIGHT FOOT COMPLETE - 3+ VIEW COMPARISON:  None. FINDINGS: The mineralization and alignment are normal. There is no evidence of acute fracture or dislocation. There are moderate degenerative changes of the first metacarpal phalangeal joint. There are mild midfoot degenerative changes. Possible mild soft tissue swelling of the dorsum of the midfoot. IMPRESSION: No acute osseous findings. Degenerative changes as described with possible dorsal midfoot soft tissue swelling. Electronically Signed   By: Carey Bullocks M.D.   On: 03/10/2017 20:59    Procedures Procedures (including critical care time)  Medications Ordered in ED Medications - No  data to display   Initial Impression / Assessment and Plan / ED Course  I have reviewed the triage vital signs and the nursing notes.  Pertinent labs & imaging results that were available during my care of the patient were reviewed by me and considered in my medical decision making (see chart for details).     Patient seen and examined.  Low concern for DVT given presentation, however will perform ultrasound to rule out given calf pain.   X-ray ordered as well.  Vital signs reviewed and are as follows: BP (!) 157/86 (BP Location: Left Arm)   Pulse 78   Temp 98.7 F (37.1 C) (Oral)   Resp 18   Ht 5\' 5"  (1.651 m)   Wt 71.8 kg (158 lb 4.6 oz)   SpO2 98%   BMI 26.34 kg/m   10:03 PM patient updated on results.  She will be discharged home.  Discussed rice therapy.  NSAIDs as needed.  Podiatry referral given if not improved in 1 week.  Final Clinical Impressions(s) / ED Diagnoses   Final diagnoses:  Right foot pain   Right midfoot pain with radiation to  the calf.  Ultrasound negative for DVT.  No fractures on x-ray.  Patient does have an area of arthritis noted in the area of pain.  Possible overuse injury.  No signs of cellulitis.  New Prescriptions New Prescriptions   No medications on file     Desmond DikeGeiple, Lucina Betty, PA-C 03/10/17 2203    Loren RacerYelverton, David, MD 03/12/17 [phone]

## 2017-03-10 NOTE — ED Notes (Signed)
Patient transported to X-ray 

## 2018-06-04 IMAGING — CR DG FOOT COMPLETE 3+V*R*
3 series · 3 of 3 positions shown · non-contrast
Comparison: None.

CLINICAL DATA: Pain in the left leg extending from the foot to the
calf for 3 days. No known injury.

EXAM:
RIGHT FOOT COMPLETE - 3+ VIEW

[t foot ap right]
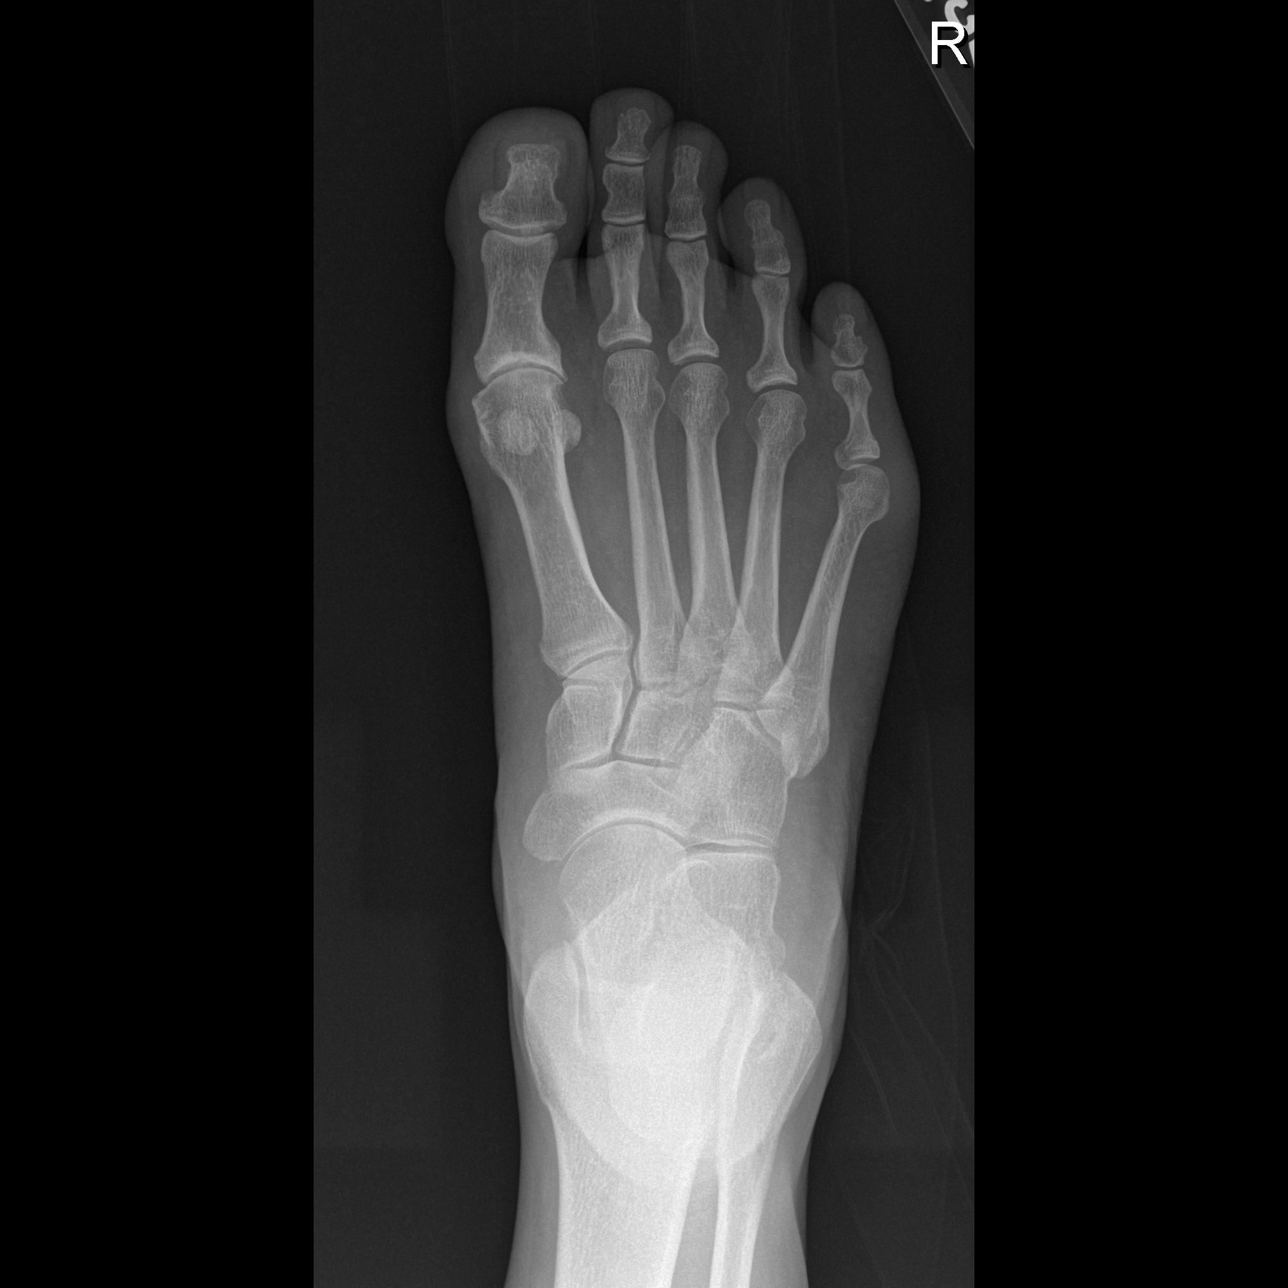

[t foot oblique right]
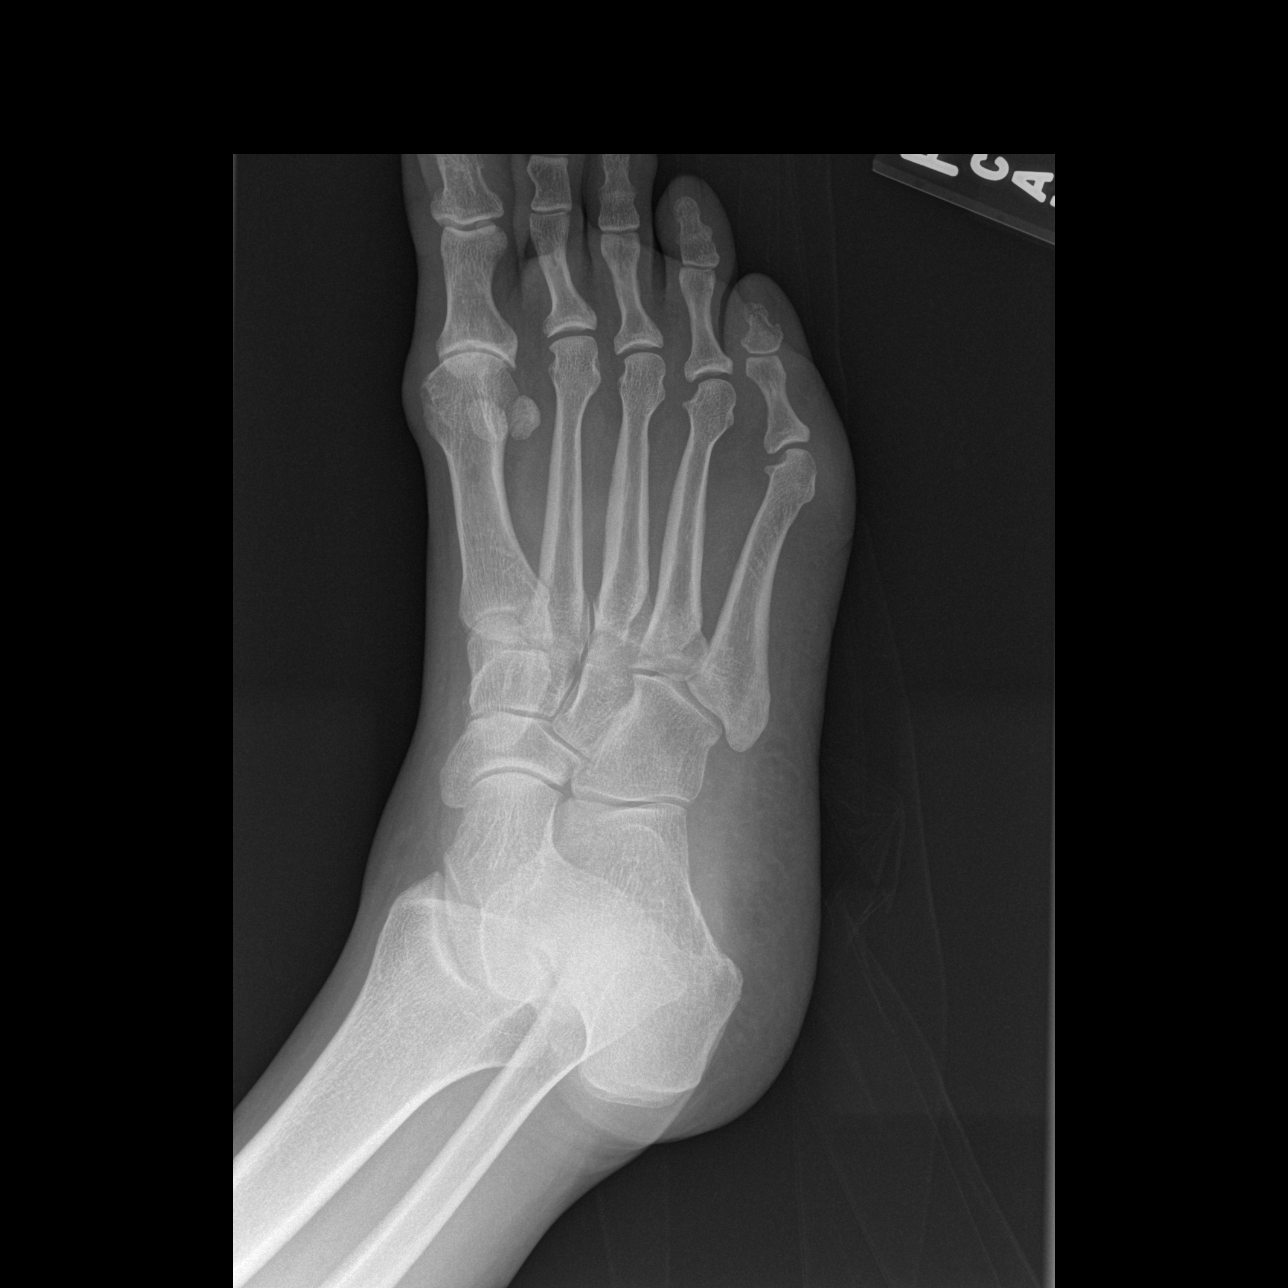

[t foot lat right]
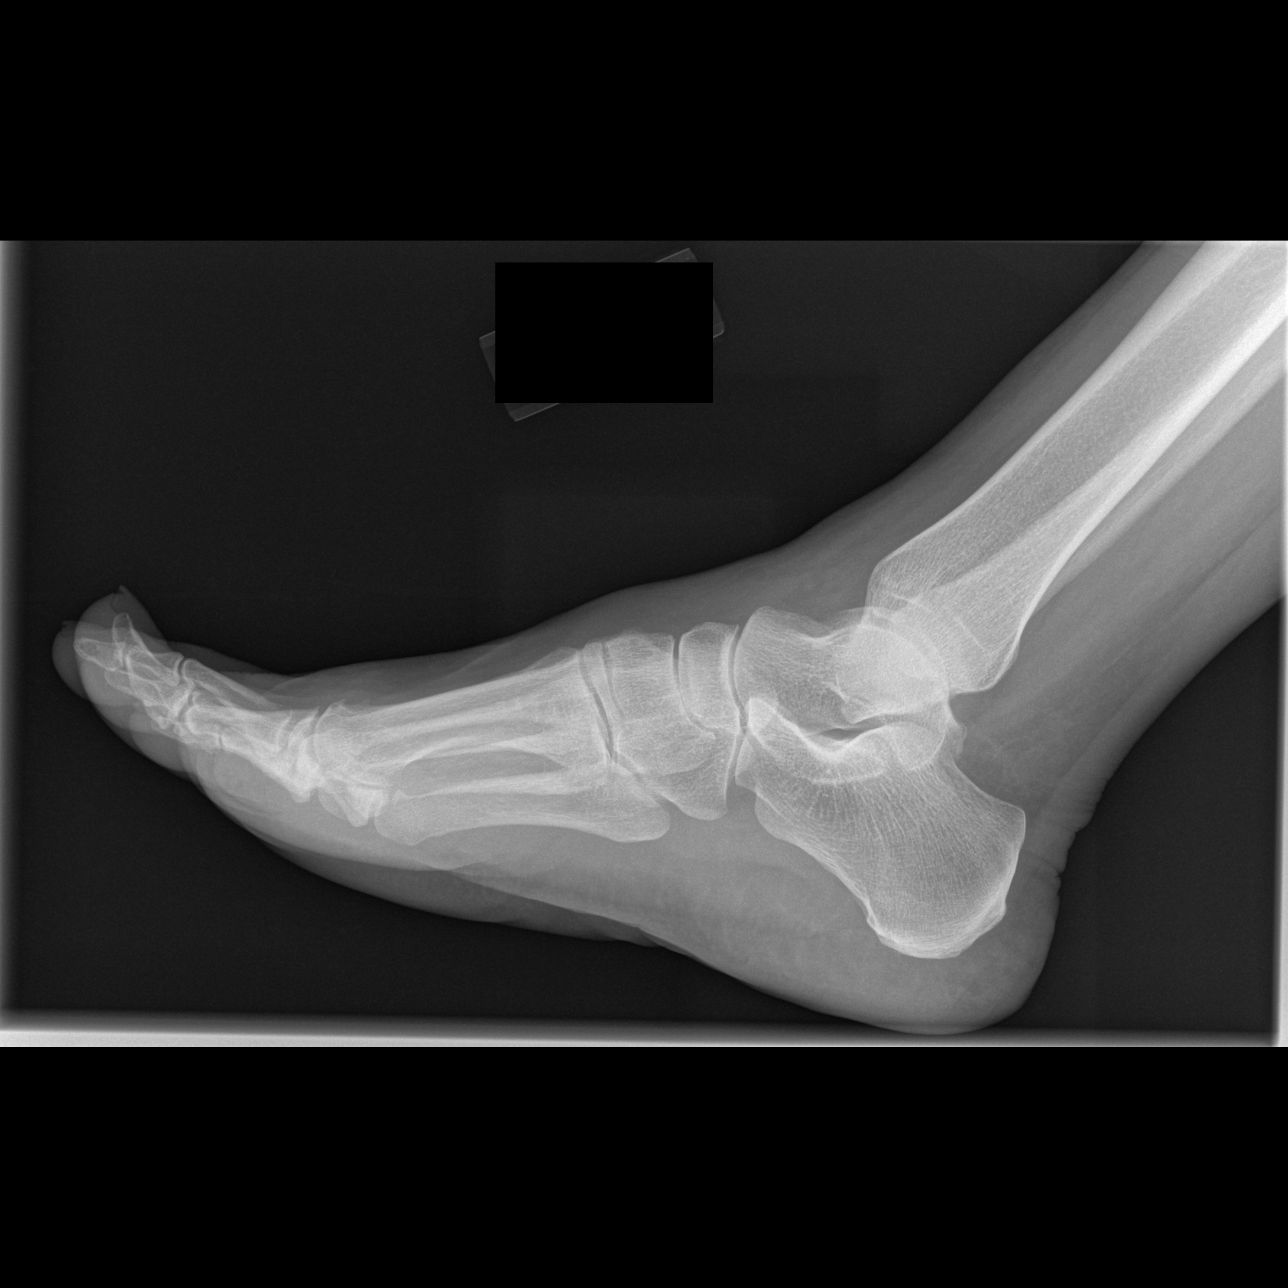

[3 of 3 positions shown; findings below may reference images not displayed]

FINDINGS: The mineralization and alignment are normal. There is no evidence of
acute fracture or dislocation. There are moderate degenerative
changes of the first metacarpal phalangeal joint. There are mild
midfoot degenerative changes. Possible mild soft tissue swelling of
the dorsum of the midfoot.
IMPRESSION: No acute osseous findings. Degenerative changes as described with
possible dorsal midfoot soft tissue swelling.

## 2018-11-18 IMAGING — US US EXTREM LOW VENOUS*R*
1 series · 13 of 24 positions shown · non-contrast
Comparison: None.

CLINICAL DATA: 61-year-old female with right lower extremity pain
and swelling.



[Series 1: us extrem low venous*right* · 0.07mm/px · 13 of 27 slices shown]
[im 1/27]
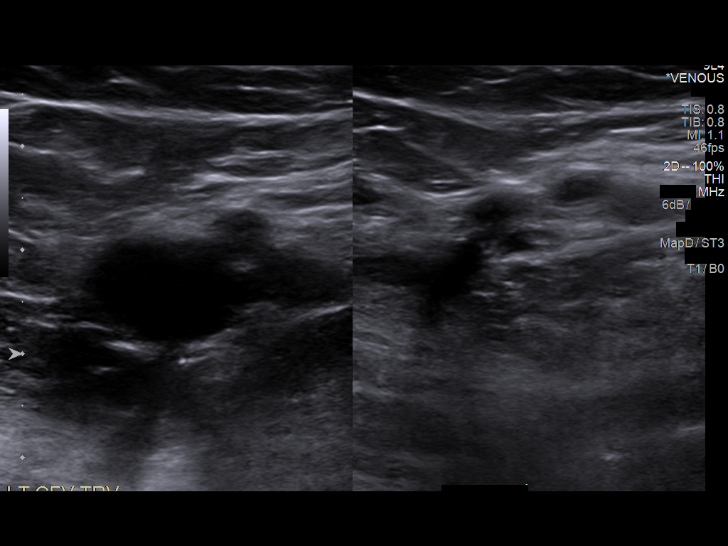
[im 3/27]
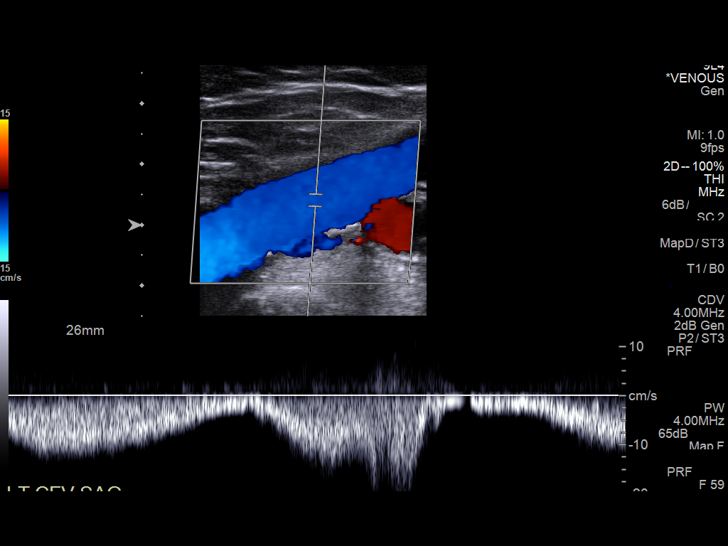
[im 5/27]
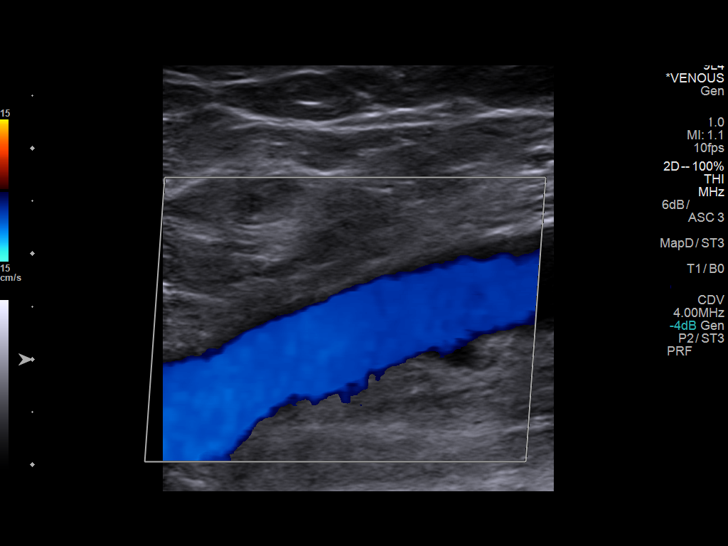
[im 7/27]
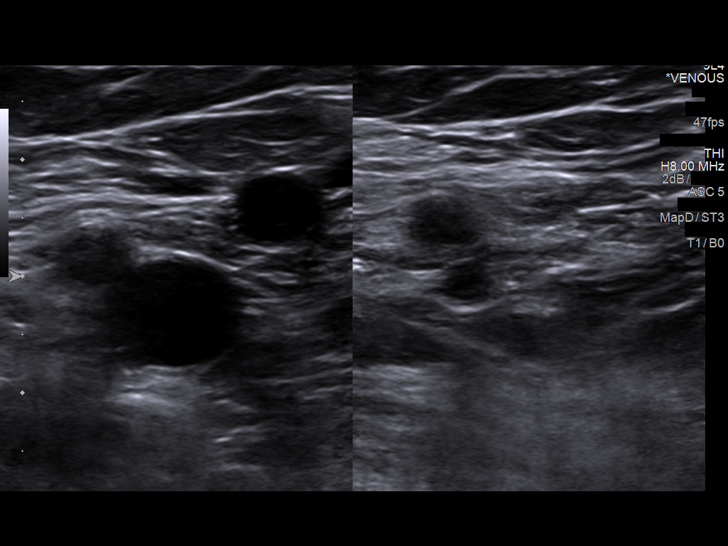
[im 10/27]
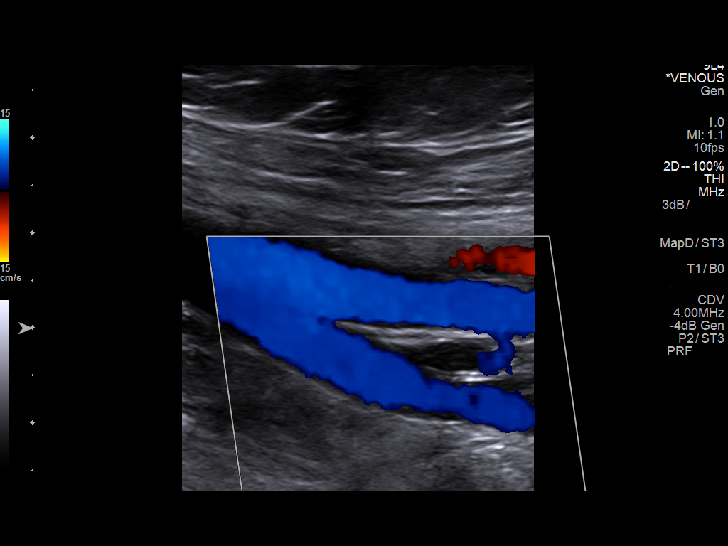
[im 12/27]
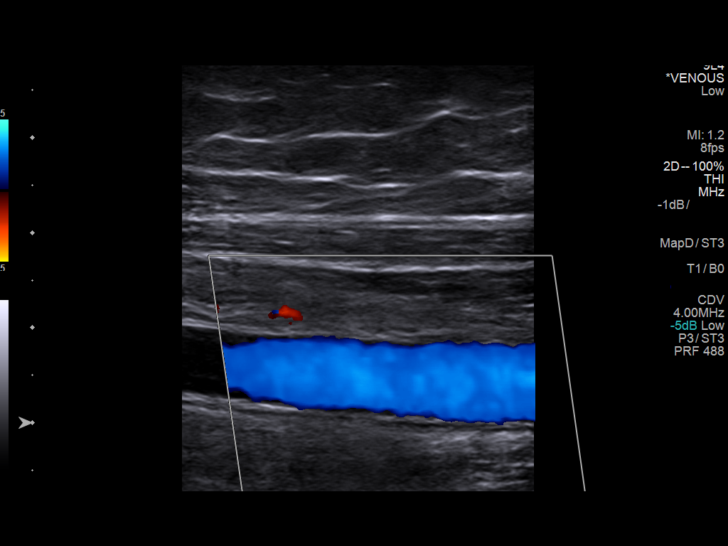
[im 14/27]
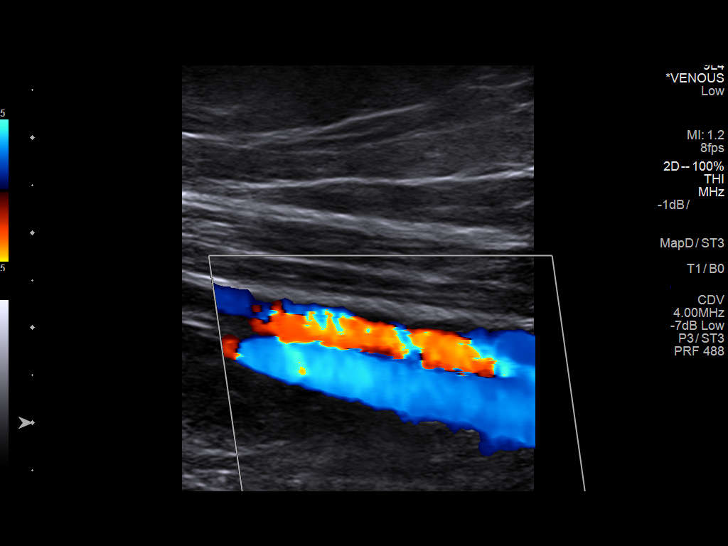
[im 15/27]
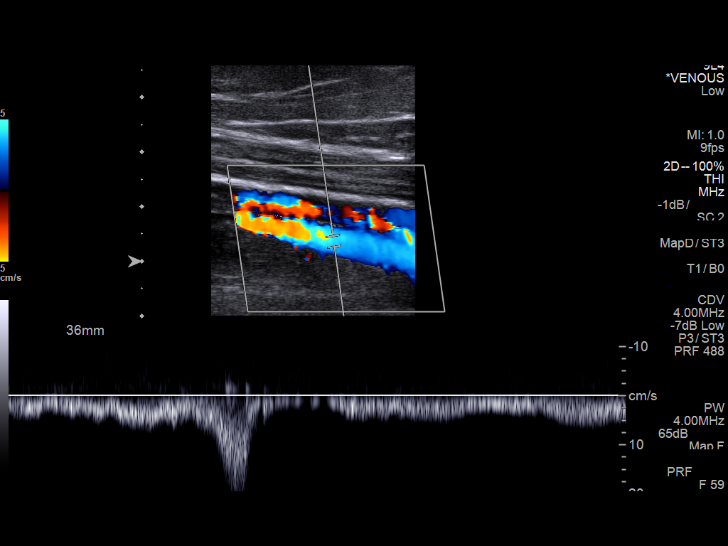
[im 17/27]
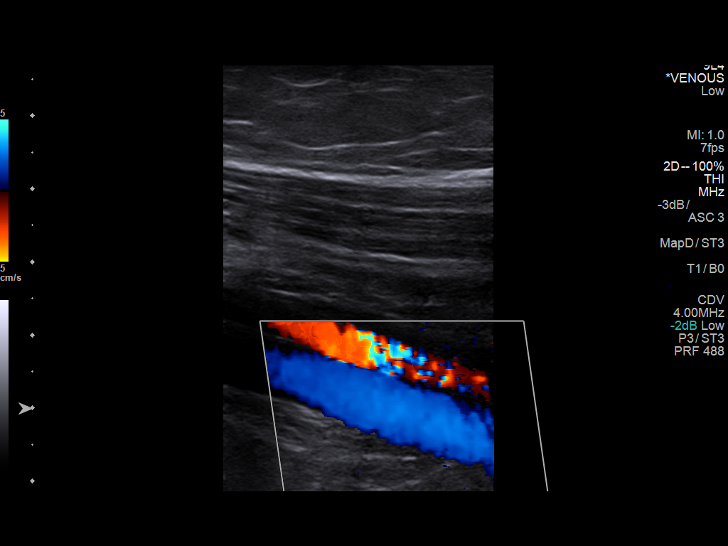
[im 20/27]
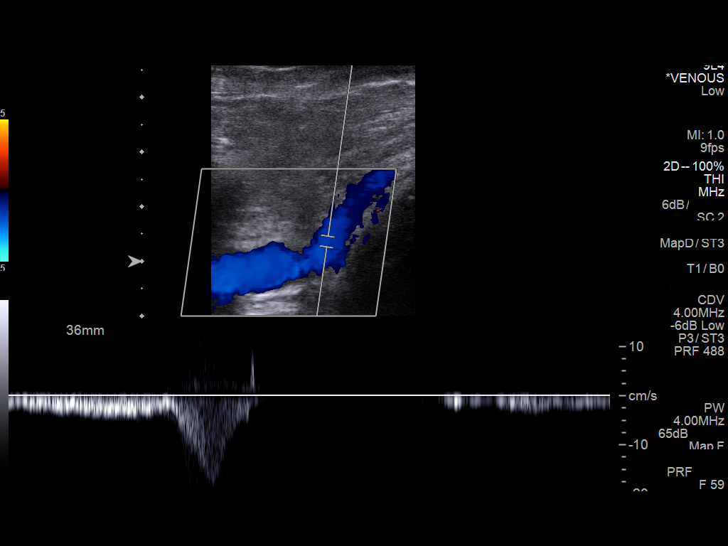
[im 22/27]
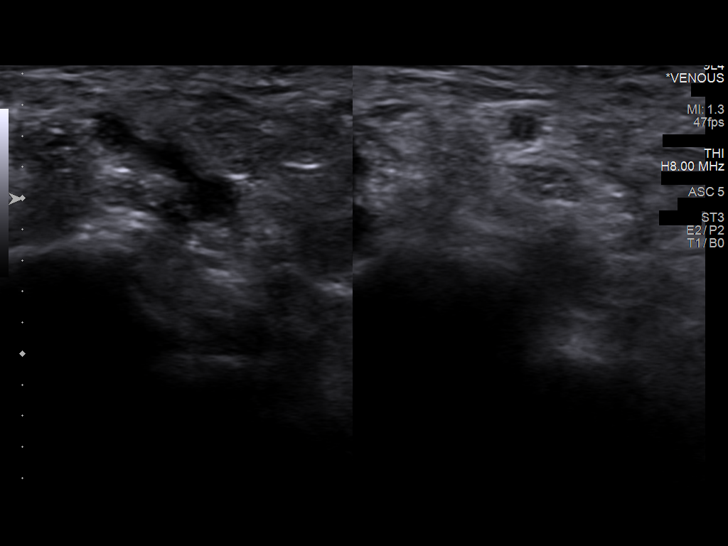
[im 24/27]
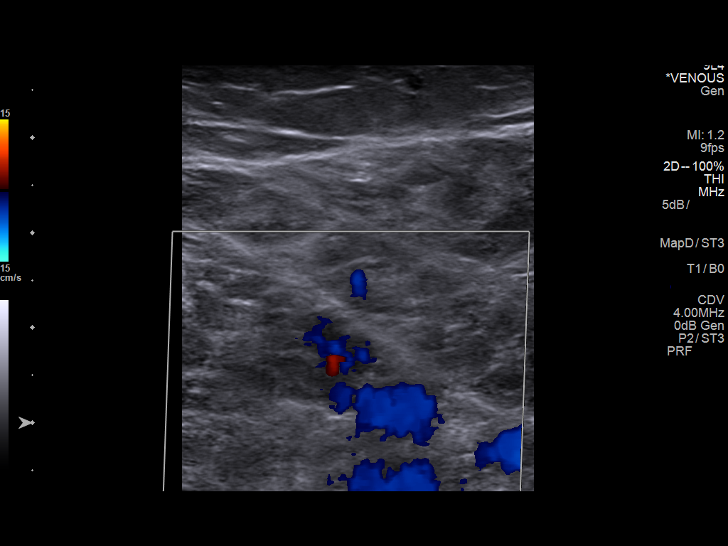
[im 27/27]
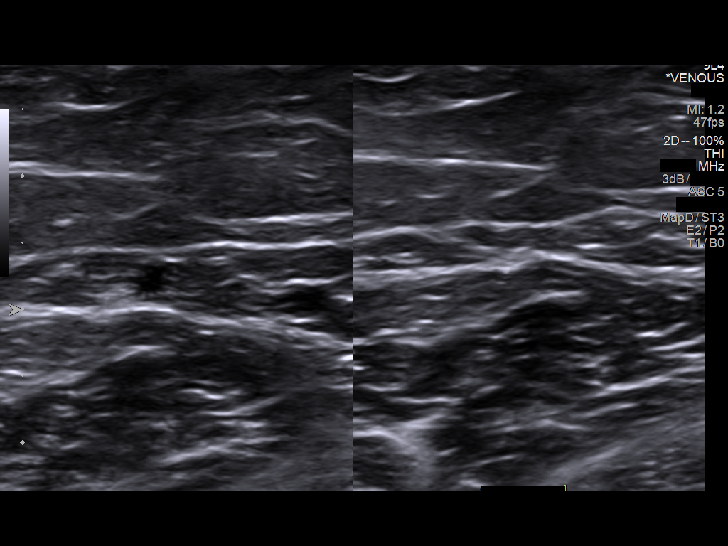

[13 of 24 positions shown; findings below may reference images not displayed]

FINDINGS: Contralateral Common Femoral Vein: Respiratory phasicity is normal
and symmetric with the symptomatic side. No evidence of thrombus.
Normal compressibility.

Common Femoral Vein: No evidence of thrombus. Normal
compressibility, respiratory phasicity and response to augmentation.

Saphenofemoral Junction: No evidence of thrombus. Normal
compressibility and flow on color Doppler imaging.

Profunda Femoral Vein: No evidence of thrombus. Normal
compressibility and flow on color Doppler imaging.

Femoral Vein: No evidence of thrombus. Normal compressibility,
respiratory phasicity and response to augmentation.

Popliteal Vein: No evidence of thrombus. Normal compressibility,
respiratory phasicity and response to augmentation.

Calf Veins: No evidence of thrombus. Normal compressibility and flow
on color Doppler imaging.

Superficial Great Saphenous Vein: No evidence of thrombus. Normal
compressibility.

Venous Reflux:  None.

Other Findings:  None.
IMPRESSION: No evidence of deep venous thrombosis in the right lower extremity.

## 2022-08-13 ENCOUNTER — Emergency Department (HOSPITAL_BASED_OUTPATIENT_CLINIC_OR_DEPARTMENT_OTHER): Payer: BC Managed Care – PPO

## 2022-08-13 ENCOUNTER — Encounter (HOSPITAL_BASED_OUTPATIENT_CLINIC_OR_DEPARTMENT_OTHER): Payer: Self-pay | Admitting: Emergency Medicine

## 2022-08-13 ENCOUNTER — Emergency Department (HOSPITAL_BASED_OUTPATIENT_CLINIC_OR_DEPARTMENT_OTHER)
Admission: EM | Admit: 2022-08-13 | Discharge: 2022-08-13 | Disposition: A | Discharge status: Home / Self Care | Payer: BC Managed Care – PPO | Attending: Emergency Medicine | Admitting: Emergency Medicine

## 2022-08-13 DIAGNOSIS — R030 Elevated blood-pressure reading, without diagnosis of hypertension: Secondary | ICD-10-CM | POA: Diagnosis not present

## 2022-08-13 DIAGNOSIS — Z79890 Hormone replacement therapy (postmenopausal): Secondary | ICD-10-CM | POA: Insufficient documentation

## 2022-08-13 DIAGNOSIS — M79651 Pain in right thigh: Secondary | ICD-10-CM | POA: Diagnosis present

## 2022-08-13 DIAGNOSIS — M5459 Other low back pain: Secondary | ICD-10-CM | POA: Insufficient documentation

## 2022-08-13 DIAGNOSIS — I700 Atherosclerosis of aorta: Secondary | ICD-10-CM | POA: Diagnosis not present

## 2022-08-13 DIAGNOSIS — M47816 Spondylosis without myelopathy or radiculopathy, lumbar region: Secondary | ICD-10-CM | POA: Diagnosis not present

## 2022-08-13 DIAGNOSIS — M5431 Sciatica, right side: Secondary | ICD-10-CM | POA: Insufficient documentation

## 2022-08-13 DIAGNOSIS — E039 Hypothyroidism, unspecified: Secondary | ICD-10-CM | POA: Diagnosis not present

## 2022-08-13 DIAGNOSIS — R55 Syncope and collapse: Secondary | ICD-10-CM | POA: Insufficient documentation

## 2022-08-13 LAB — BASIC METABOLIC PANEL
Anion gap: 8 (ref 5–15)
BUN: 8 mg/dL (ref 8–23)
CO2: 27 mmol/L (ref 22–32)
Calcium: 8.4 mg/dL — ABNORMAL LOW (ref 8.9–10.3)
Chloride: 102 mmol/L (ref 98–111)
Creatinine, Ser: 0.86 mg/dL (ref 0.44–1.00)
GFR, Estimated: >60 mL/min (ref >60)
Glucose, Bld: 103 mg/dL — ABNORMAL HIGH (ref 70–99)
Potassium: 4.2 mmol/L (ref 3.5–5.1)
Sodium: 137 mmol/L (ref 135–145)

## 2022-08-13 LAB — CBC WITH DIFFERENTIAL/PLATELET
Abs Immature Granulocytes: 0.02 10*3/uL (ref 0.00–0.07)
Basophils Absolute: 0 10*3/uL (ref 0.0–0.1)
Basophils Relative: 0 %
Eosinophils Absolute: 0.1 10*3/uL (ref 0.0–0.5)
Eosinophils Relative: 1 %
HCT: 37.8 % (ref 36.0–46.0)
Hemoglobin: 12.2 g/dL (ref 12.0–15.0)
Immature Granulocytes: 0 %
Lymphocytes Relative: 47 %
Lymphs Abs: 2.1 10*3/uL (ref 0.7–4.0)
MCH: 30.4 pg (ref 26.0–34.0)
MCHC: 32.3 g/dL (ref 30.0–36.0)
MCV: 94.3 fL (ref 80.0–100.0)
Monocytes Absolute: 0.4 10*3/uL (ref 0.1–1.0)
Monocytes Relative: 9 %
Neutro Abs: 2 10*3/uL (ref 1.7–7.7)
Neutrophils Relative %: 43 %
Platelets: 223 10*3/uL (ref 150–400)
RBC: 4.01 MIL/uL (ref 3.87–5.11)
RDW: 11.9 % (ref 11.5–15.5)
WBC: 4.6 10*3/uL (ref 4.0–10.5)
nRBC: 0 % (ref 0.0–0.2)

## 2022-08-13 LAB — URINALYSIS, ROUTINE W REFLEX MICROSCOPIC
Bilirubin Urine: NEGATIVE
Glucose, UA: NEGATIVE mg/dL
Ketones, ur: NEGATIVE mg/dL
Leukocytes,Ua: NEGATIVE
Nitrite: NEGATIVE
Protein, ur: NEGATIVE mg/dL
Specific Gravity, Urine: 1.02 (ref 1.005–1.030)
pH: 7.5 (ref 5.0–8.0)

## 2022-08-13 LAB — TROPONIN I (HIGH SENSITIVITY)
Troponin I (High Sensitivity): 9 ng/L (ref <18)
Troponin I (High Sensitivity): 4 ng/L (ref <18)

## 2022-08-13 LAB — URINALYSIS, MICROSCOPIC (REFLEX)

## 2022-08-13 MED ORDER — METHYLPREDNISOLONE 4 MG PO TBPK: ORAL_TABLET | ORAL | 0 refills | Status: AC

## 2022-08-13 MED ORDER — KETOROLAC TROMETHAMINE 30 MG/ML IJ SOLN: 15.0000 mg | Freq: Once | INTRAMUSCULAR | Status: DC

## 2022-08-13 MED ORDER — PREDNISONE 50 MG PO TABS
60.0000 mg | ORAL_TABLET | Freq: Once | ORAL | Status: AC
  Administered 2022-08-13: 60 mg via ORAL
  Filled 2022-08-13: qty 1

## 2022-08-13 MED ORDER — GADOBUTROL 1 MMOL/ML IV SOLN
7.0000 mL | Freq: Once | INTRAVENOUS | Status: AC | PRN
  Administered 2022-08-13: 7 mL via INTRAVENOUS

## 2022-08-13 MED ORDER — HYDROCODONE-ACETAMINOPHEN 5-325 MG PO TABS
1.0000 | ORAL_TABLET | Freq: Once | ORAL | Status: AC
  Administered 2022-08-13: 1 via ORAL
  Filled 2022-08-13: qty 1

## 2022-08-13 MED ORDER — METHOCARBAMOL 500 MG PO TABS
500.0000 mg | ORAL_TABLET | Freq: Once | ORAL | Status: AC
  Administered 2022-08-13: 500 mg via ORAL
  Filled 2022-08-13: qty 1

## 2022-08-13 MED ORDER — KETOROLAC TROMETHAMINE 15 MG/ML IJ SOLN
15.0000 mg | Freq: Once | INTRAMUSCULAR | Status: AC
  Administered 2022-08-13: 15 mg via INTRAVENOUS
  Filled 2022-08-13: qty 1

## 2022-08-13 MED ORDER — METHOCARBAMOL 500 MG PO TABS: 500.0000 mg | ORAL_TABLET | Freq: Three times a day (TID) | ORAL | 0 refills | Status: AC | PRN

## 2022-08-13 MED ORDER — IOHEXOL 350 MG/ML SOLN: 100.0000 mL | Freq: Once | INTRAVENOUS | Status: DC | PRN

## 2022-08-13 MED ORDER — AMLODIPINE BESYLATE 5 MG PO TABS
5.0000 mg | ORAL_TABLET | Freq: Once | ORAL | Status: AC
  Administered 2022-08-13: 5 mg via ORAL
  Filled 2022-08-13: qty 1

## 2022-08-13 NOTE — ED Notes (Signed)
Pt has been ambulatory to the restroom multiple times. Pt ambulates independently

## 2022-08-13 NOTE — ED Notes (Signed)
ED Provider at bedside. 

## 2022-08-13 NOTE — ED Notes (Signed)
Pt transported to radiology.

## 2022-08-13 NOTE — Discharge Instructions (Addendum)
as we discussed it appears you have sciatica or pinched nerve in your back.  Take the muscle relaxers and steroids as prescribed and follow-up with your doctor.  You may require an MRI if symptoms do not improve.  Your blood pressure today is elevated and you should follow-up with your doctor regarding this for possible medication adjustments.  Keep a record of your blood pressure and check it on a regular basis for your doctor to follow-up next week.  Return to the ED with chest pain, shortness of breath, focal weakness, numbness, tingling or any other concerns.  Return to the ED sooner with worsening pain, weakness to your leg, numbness, tingling, bowel or bladder incontinence or any other concerns.

## 2022-08-13 NOTE — ED Notes (Signed)
Pt transported to MRI 

## 2022-08-13 NOTE — ED Triage Notes (Signed)
Pain in right thigh going down leg with occasional hip pain. Started wednesday

## 2022-08-13 NOTE — ED Provider Notes (Signed)
[facility] [place] Provider Note   CSN: [id number] Arrival date & time: 08/13/22  [id number]     History  Chief Complaint  Patient presents with   Leg Pain    Aimee Robinson is a 67 y.o. female.  Patient with a history of hypothyroidism, carpal tunnel syndrome presenting with a 3-day history of right leg pain.  Denies fall or trauma.  Pain starts in her right posterior thigh and radiates down to her right leg to her shin.  Feels like a burning pain taking ibuprofen at home without relief.  Denies any weakness.  Denies any numbness or tingling.  Denies any history of IV drug abuse or cancer.  No previous back surgeries.  Denies back pain but states most the pain is in her buttocks.  No pain with urination or blood in the urine.  No chest pain or shortness of breath.  No bowel or bladder incontinence.  Pain starts in her right posterior leg and radiates down to her right thigh and shin.  Patient states she "passed out" last night because the pain was severe.  States she was in bed and felt nauseated.  Did not hit head or lose consciousness.  States she passed out for several seconds in the bed.  Was was preceded by nausea, lightheadedness and blurry vision.  No chest pain or shortness of breath.  No vomiting.  No diaphoresis.  The history is provided by the patient.  Leg Pain Associated symptoms: no back pain and no fever        Home Medications Prior to Admission medications   Medication Sig Start Date End Date Taking? Authorizing Provider  fish oil-omega-3 fatty acids 1000 MG capsule Take 2 g by mouth daily.    [provider]  levothyroxine (SYNTHROID, LEVOTHROID) 88 MCG tablet Take 88 mcg by mouth daily.    [provider]      Allergies    Dilaudid [hydromorphone hcl]    Review of Systems   Review of Systems  Constitutional:  Negative for activity change, appetite change and fever.  HENT:  Negative for congestion and  rhinorrhea.   Respiratory:  Negative for cough, chest tightness and shortness of breath.   Gastrointestinal:  Negative for abdominal pain, nausea and vomiting.  Genitourinary:  Negative for dysuria and hematuria.  Musculoskeletal:  Positive for arthralgias and myalgias. Negative for back pain.  Skin:  Negative for rash.  Neurological:  Negative for dizziness, weakness and headaches.   all other systems are negative except as noted in the HPI and PMH.    Physical Exam Updated Vital Signs BP (!) 179/81 (BP Location: Right Arm)   Pulse (!) 59   Temp 98.6 F (37 C) (Oral)   Resp 18   Ht 5\' 5"  (1.651 m)   Wt 70.3 kg   SpO2 100%   BMI 25.79 kg/m  Physical Exam Vitals and nursing note reviewed.  Constitutional:      General: She is not in acute distress.    Appearance: She is well-developed.  HENT:     Head: Normocephalic and atraumatic.     Mouth/Throat:     Pharynx: No oropharyngeal exudate.  Eyes:     Conjunctiva/sclera: Conjunctivae normal.     Pupils: Pupils are equal, round, and reactive to light.  Neck:     Comments: No meningismus. Cardiovascular:     Rate and Rhythm: Normal rate and regular rhythm.     Heart sounds: Normal  heart sounds. No murmur heard. Pulmonary:     Effort: Pulmonary effort is normal. No respiratory distress.     Breath sounds: Normal breath sounds.  Abdominal:     Palpations: Abdomen is soft.     Tenderness: There is no abdominal tenderness. There is no guarding or rebound.  Musculoskeletal:        General: No tenderness. Normal range of motion.     Cervical back: Normal range of motion and neck supple.     Comments: 5/5 strength in bilateral lower extremities. Ankle plantar and dorsiflexion intact. Great toe extension intact bilaterally. +2 DP and PT pulses. +2 patellar reflexes bilaterally. Normal gait.   Skin:    General: Skin is warm.  Neurological:     Mental Status: She is alert and oriented to person, place, and time.     Cranial  Nerves: No cranial nerve deficit.     Motor: No abnormal muscle tone.     Coordination: Coordination normal.     Comments:  5/5 strength throughout. CN 2-12 intact.Equal grip strength.   Psychiatric:        Behavior: Behavior normal.     ED Results / Procedures / Treatments   Labs (all labs ordered are listed, but only abnormal results are displayed) Labs Reviewed  BASIC METABOLIC PANEL - Abnormal; Notable for the following components:      Result Value   Glucose, Bld 103 (*)    Calcium 8.4 (*)    All other components within normal limits  URINALYSIS, ROUTINE W REFLEX MICROSCOPIC - Abnormal; Notable for the following components:   Hgb urine dipstick TRACE (*)    All other components within normal limits  URINALYSIS, MICROSCOPIC (REFLEX) - Abnormal; Notable for the following components:   Bacteria, UA MANY (*)    All other components within normal limits  CBC WITH DIFFERENTIAL/PLATELET  TROPONIN I (HIGH SENSITIVITY)  TROPONIN I (HIGH SENSITIVITY)    EKG EKG Interpretation  Date/Time:  Saturday August 13 2022 07:24:45 EDT Ventricular Rate:  56 PR Interval:  176 QRS Duration: 93 QT Interval:  462 QTC Calculation: 446 R Axis:   49 Text Interpretation: Sinus rhythm No significant change was found Confirmed by Ezequiel Essex [phone]) on 08/13/2022 7:29:28 AM  Radiology MR ANGIO CHEST W WO CONTRAST  Result Date: 08/13/2022 CLINICAL DATA:  Acute aortic syndrome, hypertension, flank pain, evaluate for aortic dissection EXAM: MRA CHEST WITH OR WITHOUT CONTRAST TECHNIQUE: Angiographic images of the chest were obtained using MRA technique with intravenous contrast. CONTRAST:  49mL GADAVIST GADOBUTROL 1 MMOL/ML IV SOLN COMPARISON:  None Available. FINDINGS: Cardiovascular: No significant vascular findings. Normal contour and caliber of the thoracic aorta. No evidence of aneurysm, dissection, or other acute aortic pathology. Normal heart size. No pericardial effusion. Mediastinum/Nodes:  No enlarged mediastinal, hilar, or axillary lymph nodes. Thyroid gland, trachea, and esophagus demonstrate no significant findings. Limited lungs/Pleura: Lungs are clear without obvious abnormality. No pleural effusion. Upper Abdomen: No acute abnormality. Musculoskeletal: No chest wall abnormality. No acute osseous findings. IMPRESSION: Normal contour and caliber of the thoracic aorta. No evidence of aneurysm, dissection, or other acute aortic pathology. Electronically Signed   By: Delanna Ahmadi M.D.   On: 08/13/2022 13:57   US Aorta  Result Date: 08/13/2022 CLINICAL DATA:  Syncope, flank pain EXAM: ULTRASOUND OF ABDOMINAL AORTA TECHNIQUE: Ultrasound examination of the abdominal aorta and proximal common iliac arteries was performed to evaluate for aneurysm. Additional color and Doppler images of the distal aorta were obtained  to document patency. COMPARISON:  None Available. FINDINGS: Abdominal aortic measurements as follows: Proximal:  2.2 x 2.5 cm Mid:  1.4 x 1.4 cm Distal:  1.5 x 1.5 cm Patent: Yes, peak systolic velocity is 97 cm/s Right common iliac artery: 1.0 x 1.1 cm Left common iliac artery: 1.1 x 1.1 cm Aortic atherosclerosis. IMPRESSION: No evidence of abdominal aortic aneurysm. Aortic atherosclerosis. Electronically Signed   By: Delanna Ahmadi M.D.   On: 08/13/2022 10:01   DG Lumbar Spine Complete  Result Date: 08/13/2022 CLINICAL DATA:  67 year old female with history of sciatica. EXAM: LUMBAR SPINE - COMPLETE 4+ VIEW COMPARISON:  No priors. FINDINGS: Multiple views of the lumbar spine demonstrate no acute displaced fractures or compression type fractures. Dextroscoliosis of the thoracolumbar spine convex to the right at the level of L2. No definite defects of the pars interarticularis. Mild multilevel degenerative disc disease and facet arthropathy, most severe at L5-S1. Atherosclerotic calcifications in the abdominal aorta. IMPRESSION: 1. No acute radiographic abnormality of the lumbar spine.  2. Dextroscoliosis of the thoracolumbar spine with mild multilevel degenerative disc disease and lumbar spondylosis, as above. Electronically Signed   By: Vinnie Langton M.D.   On: 08/13/2022 07:39    Procedures Procedures    Medications Ordered in ED Medications  predniSONE (DELTASONE) tablet 60 mg (has no administration in time range)  methocarbamol (ROBAXIN) tablet 500 mg (has no administration in time range)    ED Course/ Medical Decision Making/ A&P                             Medical Decision Making Amount and/or Complexity of Data Reviewed Labs: ordered. Decision-making details documented in ED Course. Radiology: ordered and independent interpretation performed. Decision-making details documented in ED Course. ECG/medicine tests: ordered and independent interpretation performed. Decision-making details documented in ED Course.  Risk Prescription drug management.  3 days of right leg pain radiating down to the shin.  No trauma.  Vital stable, no distress.  Intact distal strength, sensation, pulses and reflexes.  Suspect sciatica.  Low suspicion for cord compression or cauda equina.  Syncopal episode last night in the setting of pain.  No chest pain or shortness of breath.  No head trauma.  Will check labs, EKG, urinalysis.  No indication for emergent MRI  EKG is sinus rhythm.  No Brugada, prolonged QT.  Hypertensive with no history of same.  Denies any chest pain or shortness of breath.  Labs are reassuring with normal electrolytes, creatinine and troponin.  Ultrasound the aorta is normal.  No evidence of aneurysm.  Patient's pain has improved.  She is able to ambulate. Urinalysis negative.  Labs reassuring.  Troponin negative x 2.  Low suspicion for ACS or pulmonary embolism.    Blood pressure remains elevated.  Labs are reassuring.  No evidence of endorgan damage.  Troponin negative x 2, creatinine is normal.  Patient suspect secondary to pain.  Denies any chest  pain, back pain or low back pain.  Complains of pain going down her right leg only.  No history of hypertension.  No new onset hypertension, aortic dissection was considered though she has no chest pain or back pain or flank pain.  She has a anaphylaxis allergy to IV contrast however.  Unable to obtain CTA.  MRI was obtained in its place and is negative for any acute aortic pathology in the chest.  Patient tolerating p.o. and ambulatory.  She is informed  of her elevated blood pressure need for follow-up with her PCP regarding this.  Will treat her sciatica supportively.  With steroids and muscle relaxers.  Return to the ED with worsening pain, weakness, numbness, tingling, bowel or bladder incontinence, or any other concerns        Final Clinical Impression(s) / ED Diagnoses Final diagnoses:  Sciatica of right side  Elevated blood pressure reading    Rx / DC Orders ED Discharge Orders     None         Corrina Steffensen, Annie Main, MD 08/13/22 1439

## 2022-08-13 NOTE — ED Notes (Signed)
Pt transported to [facility]
